# Patient Record
Sex: Male | Born: 1955 | Race: Black or African American | Hispanic: No | Smoking: Current some day smoker
Health system: Southern US, Community
[De-identification: ages and names within clinical notes are randomized; demographics above are authoritative.]

---

## 2003-04-28 ENCOUNTER — Emergency Department (HOSPITAL_COMMUNITY): Admission: EM | Admit: 2003-04-28 | Discharge: 2003-04-28 | Payer: Self-pay | Admitting: Emergency Medicine

## 2003-04-28 ENCOUNTER — Encounter: Payer: Self-pay | Admitting: Emergency Medicine

## 2007-08-01 ENCOUNTER — Emergency Department (HOSPITAL_COMMUNITY): Admission: EM | Admit: 2007-08-01 | Discharge: 2007-08-01 | Payer: Self-pay | Admitting: Emergency Medicine

## 2014-06-18 ENCOUNTER — Emergency Department (HOSPITAL_COMMUNITY): Payer: Self-pay

## 2014-06-18 ENCOUNTER — Emergency Department (HOSPITAL_COMMUNITY)
Admission: EM | Admit: 2014-06-18 | Discharge: 2014-06-18 | Disposition: A | Discharge status: Home / Self Care | Payer: Self-pay | Attending: Emergency Medicine | Admitting: Emergency Medicine

## 2014-06-18 ENCOUNTER — Encounter (HOSPITAL_COMMUNITY): Payer: Self-pay | Admitting: Emergency Medicine

## 2014-06-18 DIAGNOSIS — F17200 Nicotine dependence, unspecified, uncomplicated: Secondary | ICD-10-CM | POA: Insufficient documentation

## 2014-06-18 DIAGNOSIS — S9030XA Contusion of unspecified foot, initial encounter: Secondary | ICD-10-CM | POA: Insufficient documentation

## 2014-06-18 DIAGNOSIS — Y9389 Activity, other specified: Secondary | ICD-10-CM | POA: Insufficient documentation

## 2014-06-18 DIAGNOSIS — S93402A Sprain of unspecified ligament of left ankle, initial encounter: Secondary | ICD-10-CM

## 2014-06-18 DIAGNOSIS — S8990XA Unspecified injury of unspecified lower leg, initial encounter: Secondary | ICD-10-CM | POA: Insufficient documentation

## 2014-06-18 DIAGNOSIS — S8010XA Contusion of unspecified lower leg, initial encounter: Secondary | ICD-10-CM | POA: Insufficient documentation

## 2014-06-18 DIAGNOSIS — S7001XA Contusion of right hip, initial encounter: Secondary | ICD-10-CM

## 2014-06-18 DIAGNOSIS — S99929A Unspecified injury of unspecified foot, initial encounter: Secondary | ICD-10-CM

## 2014-06-18 DIAGNOSIS — S8012XA Contusion of left lower leg, initial encounter: Secondary | ICD-10-CM

## 2014-06-18 DIAGNOSIS — S9032XA Contusion of left foot, initial encounter: Secondary | ICD-10-CM

## 2014-06-18 DIAGNOSIS — S93409A Sprain of unspecified ligament of unspecified ankle, initial encounter: Secondary | ICD-10-CM | POA: Insufficient documentation

## 2014-06-18 DIAGNOSIS — Y92410 Unspecified street and highway as the place of occurrence of the external cause: Secondary | ICD-10-CM | POA: Insufficient documentation

## 2014-06-18 DIAGNOSIS — S99919A Unspecified injury of unspecified ankle, initial encounter: Secondary | ICD-10-CM

## 2014-06-18 MED ORDER — NAPROXEN 250 MG PO TABS
500.0000 mg | ORAL_TABLET | Freq: Once | ORAL | Status: AC
  Administered 2014-06-18: 500 mg via ORAL
  Filled 2014-06-18: qty 2

## 2014-06-18 MED ORDER — CYCLOBENZAPRINE HCL 5 MG PO TABS: 5.0000 mg | ORAL_TABLET | Freq: Three times a day (TID) | ORAL | Status: DC | PRN

## 2014-06-18 MED ORDER — NAPROXEN 500 MG PO TABS: 500.0000 mg | ORAL_TABLET | Freq: Two times a day (BID) | ORAL | Status: DC

## 2014-06-18 MED ORDER — CYCLOBENZAPRINE HCL 10 MG PO TABS
5.0000 mg | ORAL_TABLET | Freq: Once | ORAL | Status: AC
  Administered 2014-06-18: 5 mg via ORAL
  Filled 2014-06-18: qty 1
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
## 2014-06-18 NOTE — Discharge Instructions (Signed)
Ice packs until the pain and swelling are gone. Take the medications as prescribed. Use the crutches until you are able to walk on the post-op shoe. Wear the ankle support for the next couple of weeks. You can be rechecked by Dr Hilda LiasKeeling, the orthopedist on call, if you continue to have pain after the next week.

## 2014-06-18 NOTE — ED Notes (Signed)
Placed patient in ASO, post op boot, and crutches. Was supervised by Lissa HoardSonia.
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
## 2015-12-06 ENCOUNTER — Encounter (HOSPITAL_COMMUNITY): Payer: Self-pay

## 2015-12-06 ENCOUNTER — Emergency Department (HOSPITAL_COMMUNITY)
Admission: EM | Admit: 2015-12-06 | Discharge: 2015-12-06 | Disposition: A | Discharge status: Home / Self Care | Payer: Self-pay | Attending: Emergency Medicine | Admitting: Emergency Medicine

## 2015-12-06 DIAGNOSIS — F17210 Nicotine dependence, cigarettes, uncomplicated: Secondary | ICD-10-CM | POA: Insufficient documentation

## 2015-12-06 DIAGNOSIS — K642 Third degree hemorrhoids: Secondary | ICD-10-CM | POA: Insufficient documentation

## 2015-12-06 MED ORDER — OXYCODONE-ACETAMINOPHEN 5-325 MG PO TABS: 1.0000 | ORAL_TABLET | Freq: Four times a day (QID) | ORAL | Status: DC | PRN

## 2015-12-06 MED ORDER — OXYCODONE-ACETAMINOPHEN 5-325 MG PO TABS
1.0000 | ORAL_TABLET | Freq: Once | ORAL | Status: AC
  Administered 2015-12-06: 1 via ORAL
  Filled 2015-12-06: qty 1

## 2015-12-06 MED ORDER — DOCUSATE SODIUM 100 MG PO CAPS: 100.0000 mg | ORAL_CAPSULE | Freq: Two times a day (BID) | ORAL | Status: DC

## 2015-12-06 NOTE — ED Provider Notes (Signed)
CSN: [id number]     Arrival date & time 12/06/15  1406 History   First MD Initiated Contact with Patient 12/06/15 1427     Chief Complaint  Patient presents with  . Hemorrhoids     (Consider location/radiation/quality/duration/timing/severity/associated sxs/prior Treatment) Patient is a 60 y.o. male presenting with hematochezia. The history is provided by the patient (Patient states that he has a hemorrhoid that is painful).  Rectal Bleeding Quality:  Bright red Amount:  Scant Timing:  Sporadic Progression:  Resolved Chronicity:  New Context: not anal fissures   Associated symptoms: no abdominal pain     History reviewed. No pertinent past medical history. History reviewed. No pertinent past surgical history. No family history on file. Social History  Substance Use Topics  . Smoking status: Current Some Day Smoker -- 0.50 packs/day    Types: Cigarettes  . Smokeless tobacco: None  . Alcohol Use: Yes     Comment: occasionaly    Review of Systems  Constitutional: Negative for appetite change and fatigue.  HENT: Negative for congestion, ear discharge and sinus pressure.   Eyes: Negative for discharge.  Respiratory: Negative for cough.   Cardiovascular: Negative for chest pain.  Gastrointestinal: Positive for hematochezia, anal bleeding and rectal pain. Negative for abdominal pain and diarrhea.  Genitourinary: Negative for frequency and hematuria.  Musculoskeletal: Negative for back pain.  Skin: Negative for rash.  Neurological: Negative for seizures and headaches.  Psychiatric/Behavioral: Negative for hallucinations.      Allergies  Review of patient's allergies indicates no known allergies.  Home Medications   Prior to Admission medications   Not on File   BP 134/75 mmHg  Pulse 92  Temp(Src) 98.9 F (37.2 C) (Oral)  Resp 16  Ht  (1.753 m)  Wt 150 lb (68.04 kg)  BMI 22.14 kg/m2  SpO2 100% Physical Exam  Constitutional: He is oriented to person,  place, and time. He appears well-developed.  HENT:  Head: Normocephalic.  Eyes: Conjunctivae are normal.  Neck: No tracheal deviation present.  Cardiovascular:  No murmur heard. Abdominal: There is no tenderness.  Genitourinary:  Patient has a large external hemorrhoid that is tender but not bleeding presently  Musculoskeletal: Normal range of motion.  Neurological: He is oriented to person, place, and time.  Skin: Skin is warm.  Psychiatric: He has a normal mood and affect.    ED Course  Procedures (including critical care time) Labs Review Labs Reviewed - No data to display  Imaging Review No results found. I have personally reviewed and evaluated these images and lab results as part of my medical decision-making.   EKG Interpretation None      MDM   Final diagnoses:  None    Patient with large external hemorrhoid not bleeding presently. Patient has been referred to general surgery. He was instructed to get seen this week. Patient given Colace and pain medicine    Bethann Berkshire, MD 12/06/15 1455

## 2015-12-06 NOTE — ED Notes (Signed)
Pt reports that his hemorrhoids came out last night and unable to get any relief

## 2015-12-06 NOTE — Discharge Instructions (Signed)
Call Dr. Lovell Sheehan office this week to have hemorrhoids take care of,  Drink plenty of  fluids

## 2017-08-01 ENCOUNTER — Encounter (HOSPITAL_COMMUNITY): Payer: Self-pay | Admitting: *Deleted

## 2017-08-01 ENCOUNTER — Emergency Department (HOSPITAL_COMMUNITY): Payer: Self-pay

## 2017-08-01 ENCOUNTER — Emergency Department (HOSPITAL_COMMUNITY)
Admission: EM | Admit: 2017-08-01 | Discharge: 2017-08-01 | Disposition: A | Discharge status: Home / Self Care | Payer: Self-pay | Attending: Emergency Medicine | Admitting: Emergency Medicine

## 2017-08-01 DIAGNOSIS — F17210 Nicotine dependence, cigarettes, uncomplicated: Secondary | ICD-10-CM | POA: Insufficient documentation

## 2017-08-01 DIAGNOSIS — Y999 Unspecified external cause status: Secondary | ICD-10-CM | POA: Insufficient documentation

## 2017-08-01 DIAGNOSIS — Y939 Activity, unspecified: Secondary | ICD-10-CM | POA: Insufficient documentation

## 2017-08-01 DIAGNOSIS — N3000 Acute cystitis without hematuria: Secondary | ICD-10-CM | POA: Insufficient documentation

## 2017-08-01 DIAGNOSIS — S2232XA Fracture of one rib, left side, initial encounter for closed fracture: Secondary | ICD-10-CM | POA: Insufficient documentation

## 2017-08-01 DIAGNOSIS — X58XXXA Exposure to other specified factors, initial encounter: Secondary | ICD-10-CM | POA: Insufficient documentation

## 2017-08-01 DIAGNOSIS — R109 Unspecified abdominal pain: Secondary | ICD-10-CM

## 2017-08-01 DIAGNOSIS — Y929 Unspecified place or not applicable: Secondary | ICD-10-CM | POA: Insufficient documentation

## 2017-08-01 LAB — URINALYSIS, ROUTINE W REFLEX MICROSCOPIC
BACTERIA UA: NONE SEEN
Bilirubin Urine: NEGATIVE
Glucose, UA: NEGATIVE mg/dL
Hgb urine dipstick: NEGATIVE
Ketones, ur: NEGATIVE mg/dL
Nitrite: NEGATIVE
PROTEIN: NEGATIVE mg/dL
SQUAMOUS EPITHELIAL / LPF: NONE SEEN
Specific Gravity, Urine: 1.018 (ref 1.005–1.030)
pH: 6 (ref 5.0–8.0)

## 2017-08-01 LAB — CBC
HCT: 49 % (ref 39.0–52.0)
Hemoglobin: 16.6 g/dL (ref 13.0–17.0)
MCH: 31.4 pg (ref 26.0–34.0)
MCHC: 33.9 g/dL (ref 30.0–36.0)
MCV: 92.6 fL (ref 78.0–100.0)
Platelets: 193 10*3/uL (ref 150–400)
RBC: 5.29 MIL/uL (ref 4.22–5.81)
RDW: 14.4 % (ref 11.5–15.5)
WBC: 7.5 10*3/uL (ref 4.0–10.5)

## 2017-08-01 LAB — BASIC METABOLIC PANEL
Anion gap: 6 (ref 5–15)
BUN: 9 mg/dL (ref 6–20)
CO2: 29 mmol/L (ref 22–32)
Calcium: 9.5 mg/dL (ref 8.9–10.3)
Chloride: 102 mmol/L (ref 101–111)
Creatinine, Ser: 1.01 mg/dL (ref 0.61–1.24)
GFR calc Af Amer: >60 mL/min (ref >60)
GFR calc non Af Amer: >60 mL/min (ref >60)
Glucose, Bld: 96 mg/dL (ref 65–99)
Potassium: 4.3 mmol/L (ref 3.5–5.1)
Sodium: 137 mmol/L (ref 135–145)

## 2017-08-01 MED ORDER — CEPHALEXIN 500 MG PO CAPS
500.0000 mg | ORAL_CAPSULE | Freq: Once | ORAL | Status: AC
  Administered 2017-08-01: 500 mg via ORAL
  Filled 2017-08-01: qty 1

## 2017-08-01 MED ORDER — CEPHALEXIN 500 MG PO CAPS: 500.0000 mg | ORAL_CAPSULE | Freq: Four times a day (QID) | ORAL | 0 refills | Status: AC

## 2017-08-01 MED ORDER — NAPROXEN 500 MG PO TABS: 500.0000 mg | ORAL_TABLET | Freq: Two times a day (BID) | ORAL | 0 refills | Status: AC

## 2017-08-01 MED ORDER — DOCUSATE SODIUM 100 MG PO CAPS: 100.0000 mg | ORAL_CAPSULE | Freq: Two times a day (BID) | ORAL | 0 refills | Status: AC

## 2017-08-01 MED ORDER — NAPROXEN 250 MG PO TABS
500.0000 mg | ORAL_TABLET | Freq: Once | ORAL | Status: AC
  Administered 2017-08-01: 500 mg via ORAL
  Filled 2017-08-01: qty 2

## 2017-08-01 NOTE — ED Provider Notes (Signed)
[facility]-EMERGENCY DEPT Provider Note   CSN: [id number] Arrival date & time: 08/01/17  1427   History   Chief Complaint Chief Complaint  Patient presents with  . Back Pain    urinary frequency    HPI Alexander Fisher is a 61 y.o. male.  HPI Pt states he started having back pain yesterday.  It is located in the left flank area.  It increases with certain positions and movement.  He also mentioned to the nurse that he has been urinating frequently and the pain improves after urination although he denied urinary frequency to me.  No vomiting or fever.  No abdominal pain.  No weight loss. History reviewed. No pertinent past medical history.  There are no active problems to display for this patient.   History reviewed. No pertinent surgical history.     Home Medications    Prior to Admission medications   Medication Sig Start Date End Date Taking? Authorizing Provider  cephALEXin (KEFLEX) 500 MG capsule Take 1 capsule (500 mg total) by mouth 4 (four) times daily. 08/01/17   Linwood Dibbles, MD  docusate sodium (COLACE) 100 MG capsule Take 1 capsule (100 mg total) by mouth every 12 (twelve) hours. 08/01/17   Linwood Dibbles, MD  naproxen (NAPROSYN) 500 MG tablet Take 1 tablet (500 mg total) by mouth 2 (two) times daily with a meal. As needed for pain 08/01/17   Linwood Dibbles, MD    Family History No family history on file.  Social History Social History  Substance Use Topics  . Smoking status: Current Some Day Smoker    Packs/day: 0.50    Types: Cigarettes  . Smokeless tobacco: Never Used  . Alcohol use Yes     Comment: seldom      Allergies   Patient has no known allergies.   Review of Systems Review of Systems  All other systems reviewed and are negative.    Physical Exam Updated Vital Signs BP 120/89 (BP Location: Right Arm)   Pulse 62   Temp 98.1 F (36.7 C) (Oral)   Resp 18   Ht 1.753 m ( )   Wt 65.8 kg (145 lb)   SpO2 100%   BMI 21.41 kg/m   Physical  Exam  Constitutional: He appears well-developed and well-nourished. No distress.  HENT:  Head: Normocephalic and atraumatic.  Right Ear: External ear normal.  Left Ear: External ear normal.  Eyes: Conjunctivae are normal. Right eye exhibits no discharge. Left eye exhibits no discharge. No scleral icterus.  Neck: Neck supple. No tracheal deviation present.  Cardiovascular: Normal rate, regular rhythm and intact distal pulses.   Pulmonary/Chest: Effort normal and breath sounds normal. No stridor. No respiratory distress. He has no wheezes. He has no rales.  Abdominal: Soft. Bowel sounds are normal. He exhibits no distension. There is no tenderness. There is CVA tenderness (left). There is no rebound and no guarding.  Musculoskeletal: He exhibits no edema or tenderness.  Neurological: He is alert. He has normal strength. No cranial nerve deficit (no facial droop, extraocular movements intact, no slurred speech) or sensory deficit. He exhibits normal muscle tone. He displays no seizure activity. Coordination normal.  Skin: Skin is warm and dry. No rash noted.  Psychiatric: He has a normal mood and affect.  Nursing note and vitals reviewed.    ED Treatments / Results  Labs (all labs ordered are listed, but only abnormal results are displayed) Labs Reviewed  URINALYSIS, ROUTINE W REFLEX MICROSCOPIC - Abnormal; Notable  for the following:       Result Value   Leukocytes, UA LARGE (*)    All other components within normal limits  CBC  BASIC METABOLIC PANEL     Radiology Dg Abdomen 1 View  Result Date: 08/01/2017 CLINICAL DATA:  Pt c/o worsening lower back pain and right sided flank pain since last night. Pt reports urinary frequency and relief in pain when he urinates. EXAM: ABDOMEN - 1 VIEW COMPARISON:  06/18/2014 FINDINGS: Bowel gas pattern is nonobstructive. There are mildly distended loops of colon which contain moderate amounts of stool. Visualized osseous structures have a normal  appearance. Lung bases are remarkable for fibrotic changes, left greater than right. Acute infiltrate is not entirely excluded. Consider chest x-ray as needed. Suspect subacute fracture of the right anterior sixth rib. IMPRESSION: 1. Moderate stool burden. 2. Fibrotic changes at the lung bases. Consider chest x-ray to evaluate for possible superimposed acute infiltrate at the left lung base. 3. Deformity of the right anterior sixth rib, favoring subacute fracture. Electronically Signed   By: Norva Pavlov M.D.   On: 08/01/2017 19:39    Procedures Procedures (including critical care time)  Medications Ordered in ED Medications  cephALEXin (KEFLEX) capsule 500 mg (500 mg Oral Given 08/01/17 1910)     Initial Impression / Assessment and Plan / ED Course  I have reviewed the triage vital signs and the nursing notes.  Pertinent labs & imaging results that were available during my care of the patient were reviewed by me and considered in my medical decision making (see chart for details).   UA is consistent with a UTI.  Will give a dose of keflex.  X-ray shows a possibility of an anterior right sixth rib fracture however the patient's pain is on the left side. X-ray also suggests the possibility of infiltrate at left lung base however the patient does not have any cough or any respiratory symptoms.  His pain sounds musculoskeletal. I'll have him take an anti-inflammatory medication. We'll give him a course of antibiotics for urinary tract infection. Stool softener fourth stool burden noted on chest x-ray. Follow-up with primary care doctor next week to make sure his symptoms are improving.  Final Clinical Impressions(s) / ED Diagnoses   Final diagnoses:  Acute cystitis without hematuria  Flank pain  Closed fracture of one rib of left side, initial encounter    New Prescriptions New Prescriptions   CEPHALEXIN (KEFLEX) 500 MG CAPSULE    Take 1 capsule (500 mg total) by mouth 4 (four) times  daily.   DOCUSATE SODIUM (COLACE) 100 MG CAPSULE    Take 1 capsule (100 mg total) by mouth every 12 (twelve) hours.   NAPROXEN (NAPROSYN) 500 MG TABLET    Take 1 tablet (500 mg total) by mouth 2 (two) times daily with a meal. As needed for pain     Linwood Dibbles, MD 08/01/17 2033

## 2017-08-01 NOTE — Discharge Instructions (Signed)
Take the antibiotics as prescribed, follow up with a primary care doctor to make sure the symptoms resolve over the next week

## 2017-08-01 NOTE — ED Notes (Signed)
ED Provider at bedside. 

## 2017-08-01 NOTE — ED Triage Notes (Addendum)
Pt c/o worsening lower back pain since last night. Pt reports urinary frequency and relief in pain when he urinates. Denies hematuria, fever.

## 2020-10-16 ENCOUNTER — Encounter (HOSPITAL_COMMUNITY): Payer: Self-pay | Admitting: *Deleted

## 2020-10-16 ENCOUNTER — Emergency Department (HOSPITAL_COMMUNITY)
Admission: EM | Admit: 2020-10-16 | Discharge: 2020-10-16 | Disposition: A | Discharge status: Home / Self Care | Payer: Medicare (Managed Care) | Attending: Emergency Medicine | Admitting: Emergency Medicine

## 2020-10-16 ENCOUNTER — Other Ambulatory Visit: Payer: Self-pay

## 2020-10-16 ENCOUNTER — Emergency Department (HOSPITAL_COMMUNITY): Payer: Medicare (Managed Care)

## 2020-10-16 DIAGNOSIS — R509 Fever, unspecified: Secondary | ICD-10-CM | POA: Diagnosis present

## 2020-10-16 DIAGNOSIS — F17210 Nicotine dependence, cigarettes, uncomplicated: Secondary | ICD-10-CM | POA: Diagnosis not present

## 2020-10-16 DIAGNOSIS — U071 COVID-19: Secondary | ICD-10-CM

## 2020-10-16 LAB — RESP PANEL BY RT-PCR (FLU A&B, COVID) ARPGX2
Influenza A by PCR: NEGATIVE
Influenza B by PCR: NEGATIVE
SARS Coronavirus 2 by RT PCR: POSITIVE — AB

## 2020-10-16 MED ORDER — ACETAMINOPHEN 325 MG PO TABS
650.0000 mg | ORAL_TABLET | Freq: Once | ORAL | Status: AC | PRN
  Administered 2020-10-16: 650 mg via ORAL
  Filled 2020-10-16: qty 2

## 2020-10-16 NOTE — ED Provider Notes (Signed)
[facility] EMERGENCY DEPARTMENT Provider Note   CSN: [id number] Arrival date & time: 10/16/20  1627     History No chief complaint on file.   Alexander Fisher is a 64 y.o. male.  HPI 64 year old male presents with COVID-like symptoms for about a week.  Symptoms started 12/3.  Has had fever, decreased appetite, and some taste issues.  However no vomiting or diarrhea.  No shortness of breath.  He is having a cough.  There is no chest pain.  He was not vaccinated.  History reviewed. No pertinent past medical history.  There are no problems to display for this patient.   History reviewed. No pertinent surgical history.     No family history on file.  Social History   Tobacco Use  . Smoking status: Current Some Day Smoker    Packs/day: 0.50    Types: Cigarettes  . Smokeless tobacco: Never Used  Substance Use Topics  . Alcohol use: Yes    Comment: seldom   . Drug use: Yes    Types: Marijuana    Comment: last used 07/31/17    Home Medications Prior to Admission medications   Medication Sig Start Date End Date Taking? Authorizing Provider  cephALEXin (KEFLEX) 500 MG capsule Take 1 capsule (500 mg total) by mouth 4 (four) times daily. 08/01/17   Linwood Dibbles, MD  docusate sodium (COLACE) 100 MG capsule Take 1 capsule (100 mg total) by mouth every 12 (twelve) hours. 08/01/17   Linwood Dibbles, MD  naproxen (NAPROSYN) 500 MG tablet Take 1 tablet (500 mg total) by mouth 2 (two) times daily with a meal. As needed for pain 08/01/17   Linwood Dibbles, MD    Allergies    Patient has no known allergies.  Review of Systems   Review of Systems  Constitutional: Positive for fever.  Respiratory: Positive for cough. Negative for shortness of breath.   Cardiovascular: Negative for chest pain.  Gastrointestinal: Negative for diarrhea and vomiting.  All other systems reviewed and are negative.   Physical Exam Updated Vital Signs BP 118/75 (BP Location: Left Arm)   Pulse 77   Temp 99.3  F (37.4 C) (Oral)   Resp 16   SpO2 100%   Physical Exam Vitals and nursing note reviewed.  Constitutional:      Appearance: He is well-developed and well-nourished.  HENT:     Head: Normocephalic and atraumatic.     Right Ear: External ear normal.     Left Ear: External ear normal.     Nose: Nose normal.  Eyes:     General:        Right eye: No discharge.        Left eye: No discharge.  Cardiovascular:     Rate and Rhythm: Normal rate and regular rhythm.     Pulses:          Radial pulses are 2+ on the right side.  Pulmonary:     Effort: Pulmonary effort is normal.  Abdominal:     General: There is no distension.  Musculoskeletal:        General: No edema.     Cervical back: Neck supple.  Skin:    General: Skin is warm and dry.  Neurological:     Mental Status: He is alert.  Psychiatric:        Mood and Affect: Mood is not anxious.     ED Results / Procedures / Treatments   Labs (all labs ordered are  listed, but only abnormal results are displayed) Labs Reviewed  RESP PANEL BY RT-PCR (FLU A&B, COVID) ARPGX2 - Abnormal; Notable for the following components:      Result Value   SARS Coronavirus 2 by RT PCR POSITIVE (*)    All other components within normal limits    EKG None  Radiology DG Chest Port 1 View  Result Date: 10/16/2020 CLINICAL DATA:  Fever and chills. EXAM: PORTABLE CHEST 1 VIEW COMPARISON:  None. FINDINGS: Mild to moderate severity diffusely increased interstitial lung markings are noted. There is no evidence of a pleural effusion or pneumothorax. The heart size and mediastinal contours are within normal limits. Mild calcification of the aortic arch is seen. The visualized skeletal structures are unremarkable. IMPRESSION: Increased interstitial lung markings which is likely, chronic in nature. A mild superimposed interstitial infiltrate cannot be excluded. Electronically Signed   By: Aram Candela M.D.   On: 10/16/2020 19:39     Procedures Procedures (including critical care time)  Medications Ordered in ED Medications  acetaminophen (TYLENOL) tablet 650 mg (650 mg Oral Given 10/16/20 1810)    ED Course  I have reviewed the triage vital signs and the nursing notes.  Pertinent labs & imaging results that were available during my care of the patient were reviewed by me and considered in my medical decision making (see chart for details).    MDM Rules/Calculators/A&P                          Patient appears to have COVID-19.  Besides fever when he checked in, his vitals are benign.  While he does not have chronic medical care, there is no indication for acute lab work or emergent admission.  I will refer him for Mab infusion based on his age.  Otherwise he appears stable for discharge home with return precautions.  Alexander Fisher was evaluated in Emergency Department on 10/16/2020 for the symptoms described in the history of present illness. He was evaluated in the context of the global COVID-19 pandemic, which necessitated consideration that the patient might be at risk for infection with the SARS-CoV-2 virus that causes COVID-19. Institutional protocols and algorithms that pertain to the evaluation of patients at risk for COVID-19 are in a state of rapid change based on information released by regulatory bodies including the CDC and federal and state organizations. These policies and algorithms were followed during the patient's care in the ED.  Final Clinical Impression(s) / ED Diagnoses Final diagnoses:  COVID-19    Rx / DC Orders ED Discharge Orders    None       Pricilla Loveless, MD 10/16/20 2218

## 2020-10-16 NOTE — ED Triage Notes (Signed)
Body aches, fever

## 2020-10-16 NOTE — ED Notes (Signed)
CRITICAL VALUE ALERT  Critical Value: COVID TEST POSITIVE   Date & Time Notied: 10/16/2020 1950  Provider Notified: Criss Alvine, EDP   Orders Received/Actions taken: No orders at this time.

## 2020-10-16 NOTE — Discharge Instructions (Signed)
It is important to follow-up with a primary care physician for further medical work-up and medical screening.

## 2021-05-30 ENCOUNTER — Ambulatory Visit
Admission: EM | Admit: 2021-05-30 | Discharge: 2021-05-30 | Disposition: A | Discharge status: Home / Self Care | Payer: Medicare HMO | Attending: Physician Assistant | Admitting: Physician Assistant

## 2021-05-30 ENCOUNTER — Encounter: Payer: Self-pay | Admitting: Emergency Medicine

## 2021-05-30 ENCOUNTER — Other Ambulatory Visit: Payer: Self-pay

## 2021-05-30 DIAGNOSIS — M791 Myalgia, unspecified site: Secondary | ICD-10-CM

## 2021-05-30 MED ORDER — MELOXICAM 15 MG PO TABS: 15.0000 mg | ORAL_TABLET | Freq: Every day | ORAL | 2 refills | Status: AC

## 2021-05-30 NOTE — ED Triage Notes (Signed)
Urinary frequency.  Right hip pain that radiates down right leg. Symptoms since Friday.
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
## 2021-05-30 NOTE — Discharge Instructions (Addendum)
Return if any problems.
